# Patient Record
Sex: Female | Born: 1985 | Race: Asian | Hispanic: No | Marital: Married | Smoking: Smoker, current status unknown
Health system: Southern US, Community
[De-identification: ages and names within clinical notes are randomized; demographics above are authoritative.]

---

## 2006-10-31 ENCOUNTER — Ambulatory Visit (HOSPITAL_COMMUNITY): Admission: RE | Admit: 2006-10-31 | Discharge: 2006-10-31 | Payer: Self-pay | Admitting: Obstetrics & Gynecology

## 2007-03-30 ENCOUNTER — Inpatient Hospital Stay (HOSPITAL_COMMUNITY): Admission: AD | Admit: 2007-03-30 | Discharge: 2007-04-02 | Payer: Self-pay | Admitting: Obstetrics & Gynecology

## 2007-03-31 ENCOUNTER — Encounter: Payer: Self-pay | Admitting: Obstetrics

## 2007-06-02 IMAGING — US US OB COMP +14 WK
1 series · 13 of 28 positions shown · non-contrast
Comparison: none

CLINICAL DATA: 21 week 4 day gestational age by LMP, although dates are somewhat uncertain.  Evaluate dating and anatomy.

[Series 1: us ob comp +14 wk · 0.24mm/px · 13 of 100 slices shown]
[im 4/100]
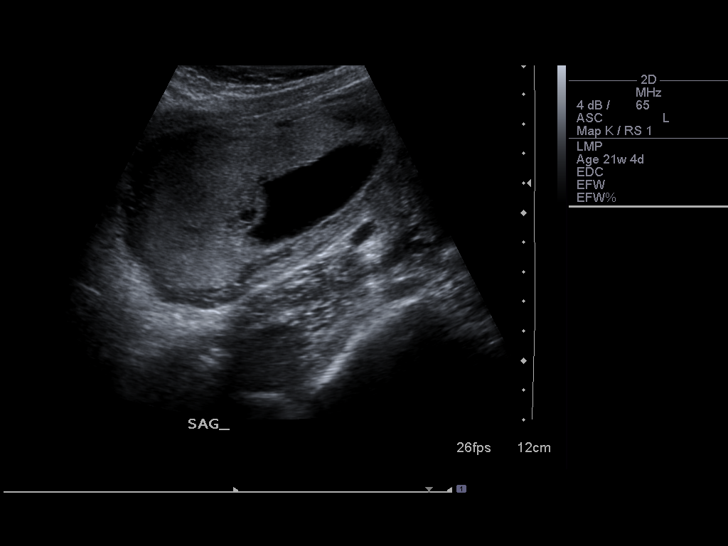
[im 12/100]
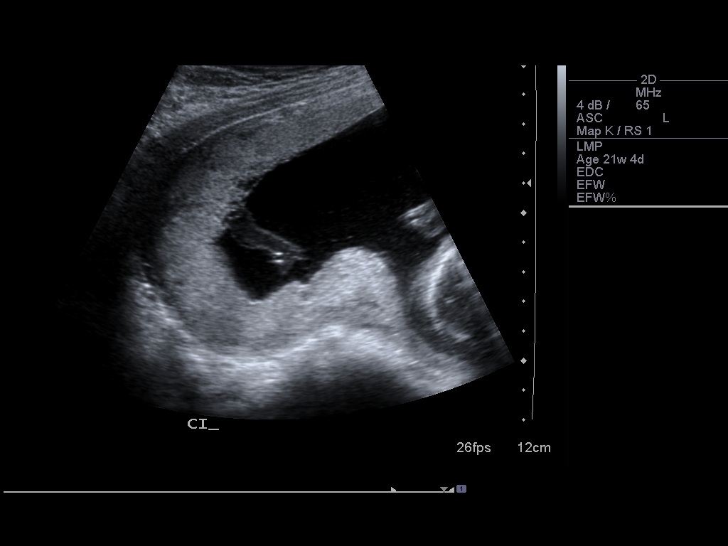
[im 19/100]
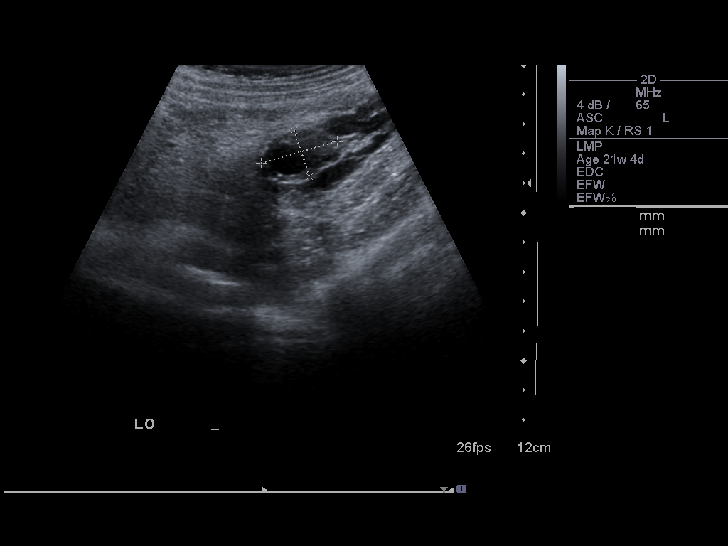
[im 26/100]
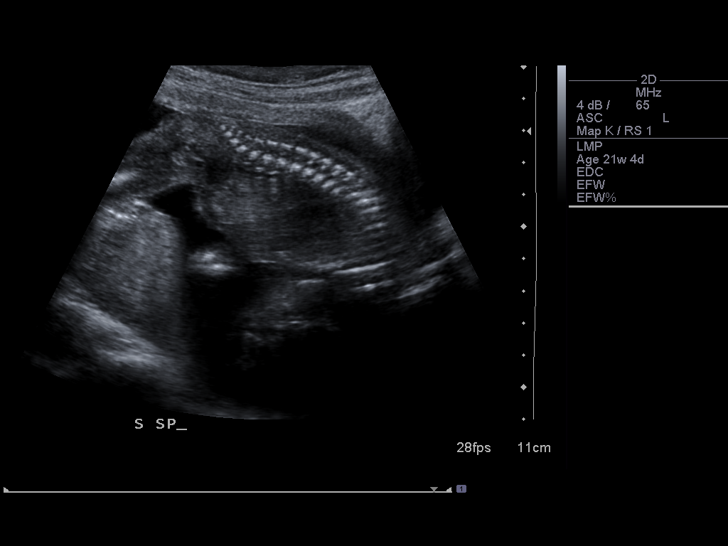
[im 34/100]
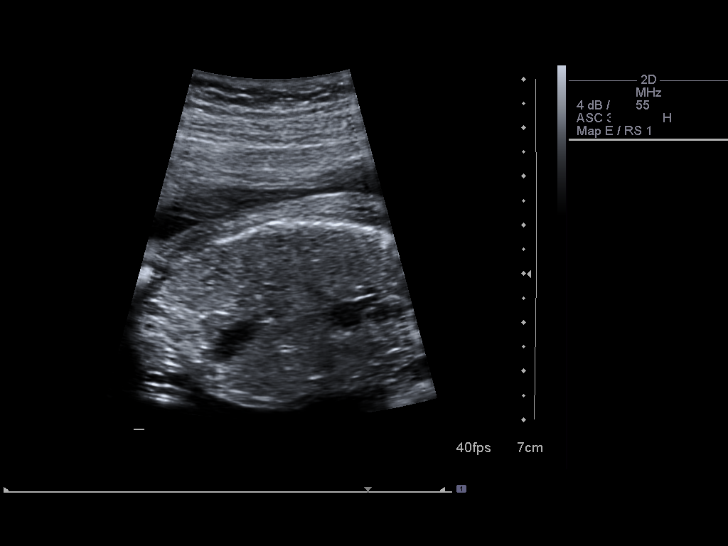
[im 41/100]
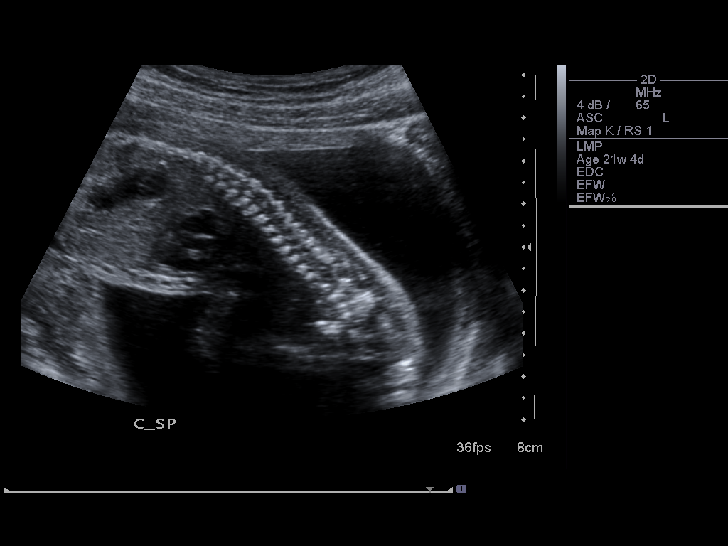
[im 52/100]
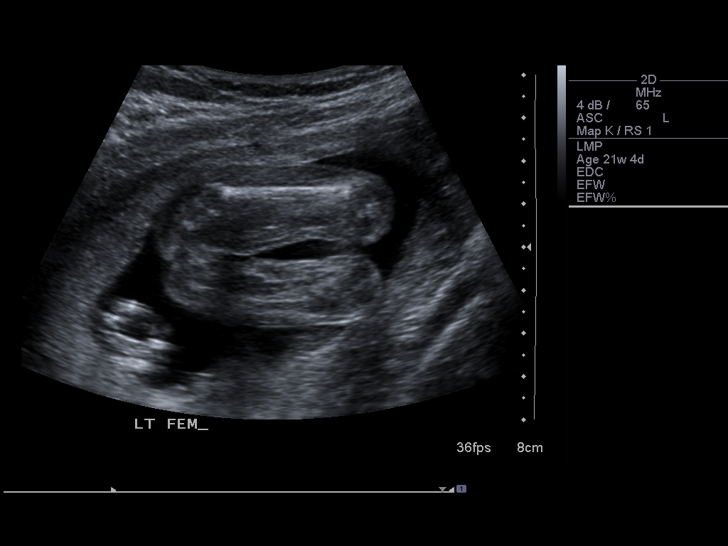
[im 59/100]
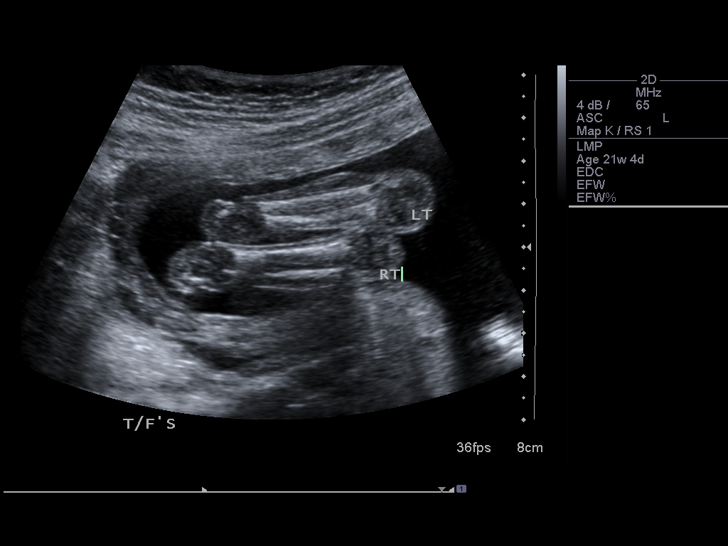
[im 67/100]
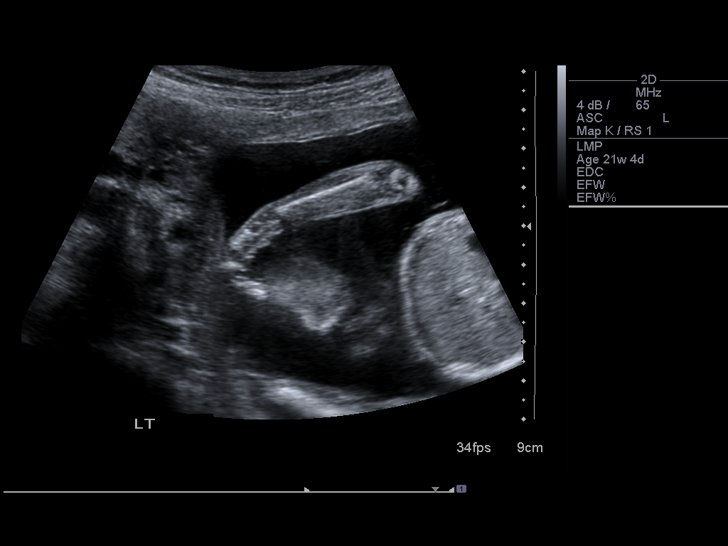
[im 74/100]
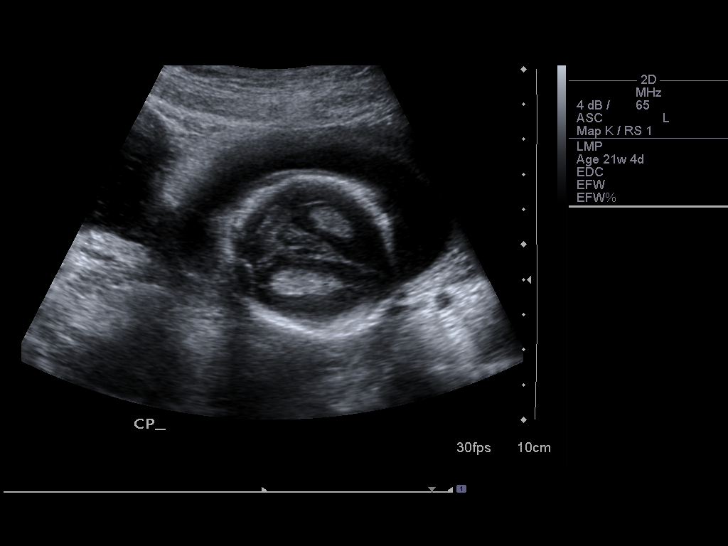
[im 81/100]
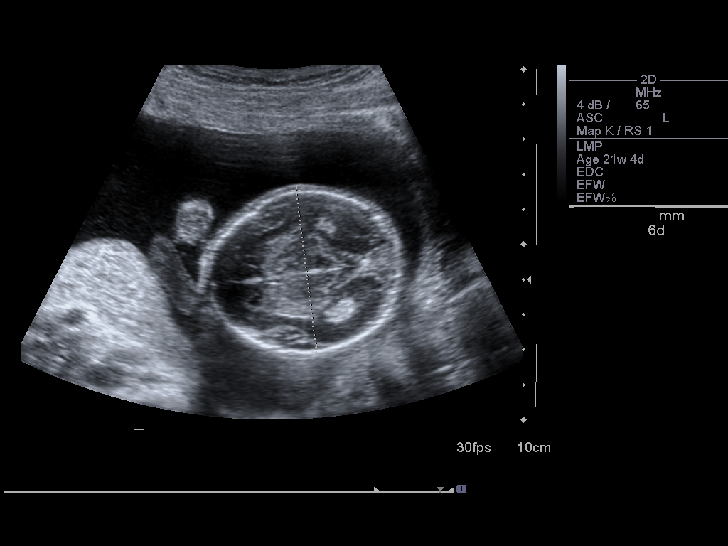
[im 89/100]
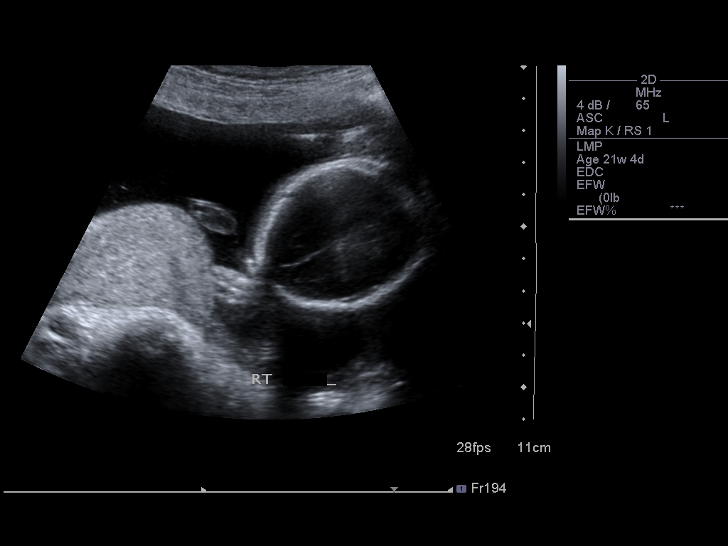
[im 96/100]
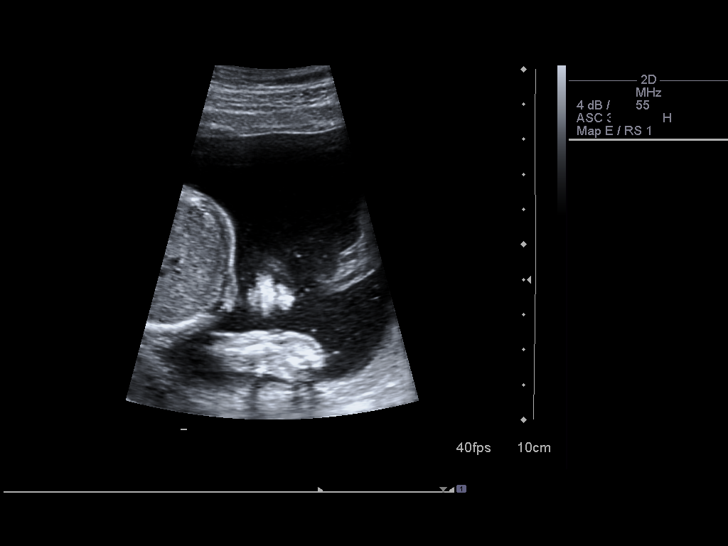

[13 of 28 positions shown; findings below may reference images not displayed]

OBSTETRICAL ULTRASOUND:
Number of Fetuses:  1
Heart Rate:  137 bpm
Movement:  Yes
Breathing:  No
Presentation:  Cephalic
Placental Location:  Fundal, anterior
Grade:  I
Previa:  No
Amniotic Fluid (Subjective):  Normal
Amniotic Fluid (Objective):  5.2 cm vertical pocket 

FETAL BIOMETRY
BPD:  4.5 cm     19 w 5 d 
HC:  16.4 cm   19 w 1 d 
AC:  14.0 cm   19 w 3 d 
FL:  3.1 cm   19 w 5 d 

MEAN GA:  19 w 4 d   US EDC:  03/23/07

FETAL ANATOMY
Lateral Ventricles:  Visualized 
Thalami/CSP:  Visualized 
Posterior Fossa:  Visualized 
Nuchal Region:  NF= 3.0 mm  Visualized 
Spine:  Visualized 
4 Chamber Heart on Left:  Visualized 
Stomach on Left:  Visualized 
3 Vessel Cord:  Visualized 
Cord Insertion site:  Visualized 
Kidneys:  Visualized 
Bladder:  Visualized 
Extremities:  Visualized 

ADDITIONAL ANATOMY VISUALIZED:  LVOT, RVOT, upper lip, orbits, profile, diaphragm, heel, ductal arch, and aortic arch.

MATERNAL UTERINE AND ADNEXAL FINDINGS
Cervix:  3.1 cm transabdominal.  

Both ovaries are unremarkable.
IMPRESSION: 1.  Single living intrauterine fetus with mean gestational age of 19 weeks 4 days and US EDC of 03/23/07.  This is 2 weeks behind EGA by LMP, and likely due to inaccurate LMP.  Correlation with menstrual history is recommended, and consider ultrasound follow-up of fetal growth in 2-3 weeks.  
2.  No evidence of fetal anatomic abnormality.

## 2008-12-30 ENCOUNTER — Inpatient Hospital Stay (HOSPITAL_COMMUNITY): Admission: AD | Admit: 2008-12-30 | Discharge: 2009-01-01 | Payer: Self-pay | Admitting: Obstetrics

## 2011-02-26 LAB — CCBB MATERNAL DONOR DRAW

## 2011-02-26 LAB — CBC
Hemoglobin: 6.2 g/dL — CL (ref 12.0–15.0)
MCHC: 31.8 g/dL (ref 30.0–36.0)
MCHC: 32.2 g/dL (ref 30.0–36.0)
MCV: 74.6 fL — ABNORMAL LOW (ref 78.0–100.0)
Platelets: 270 10*3/uL (ref 150–400)
RDW: 16.4 % — ABNORMAL HIGH (ref 11.5–15.5)
RDW: 16.5 % — ABNORMAL HIGH (ref 11.5–15.5)

## 2011-02-26 LAB — RPR: RPR Ser Ql: NONREACTIVE

## 2019-11-15 ENCOUNTER — Ambulatory Visit: Payer: Medicaid Other | Attending: Internal Medicine

## 2019-11-15 DIAGNOSIS — Z20822 Contact with and (suspected) exposure to covid-19: Secondary | ICD-10-CM

## 2019-11-16 LAB — NOVEL CORONAVIRUS, NAA: SARS-CoV-2, NAA: DETECTED — AB

## 2019-11-24 ENCOUNTER — Other Ambulatory Visit: Payer: Medicaid Other

## 2019-11-25 ENCOUNTER — Ambulatory Visit: Payer: Medicaid Other | Attending: Internal Medicine

## 2019-11-25 DIAGNOSIS — Z20822 Contact with and (suspected) exposure to covid-19: Secondary | ICD-10-CM

## 2019-11-26 ENCOUNTER — Other Ambulatory Visit: Payer: Medicaid Other

## 2019-11-26 LAB — NOVEL CORONAVIRUS, NAA: SARS-CoV-2, NAA: NOT DETECTED

## 2019-12-21 ENCOUNTER — Other Ambulatory Visit: Payer: Self-pay

## 2019-12-21 ENCOUNTER — Other Ambulatory Visit (HOSPITAL_COMMUNITY)
Admission: RE | Admit: 2019-12-21 | Discharge: 2019-12-21 | Disposition: A | Discharge status: Home / Self Care | Payer: Medicaid Other | Source: Ambulatory Visit | Attending: Obstetrics & Gynecology | Admitting: Obstetrics & Gynecology

## 2019-12-21 ENCOUNTER — Encounter: Payer: Self-pay | Admitting: Obstetrics & Gynecology

## 2019-12-21 ENCOUNTER — Ambulatory Visit: Payer: Medicaid Other | Admitting: Obstetrics & Gynecology

## 2019-12-21 VITALS — BP 124/86 | HR 92 | Ht 63.0 in | Wt 179.7 lb

## 2019-12-21 DIAGNOSIS — Z3202 Encounter for pregnancy test, result negative: Secondary | ICD-10-CM

## 2019-12-21 DIAGNOSIS — R87610 Atypical squamous cells of undetermined significance on cytologic smear of cervix (ASC-US): Secondary | ICD-10-CM | POA: Diagnosis present

## 2019-12-21 DIAGNOSIS — R87810 Cervical high risk human papillomavirus (HPV) DNA test positive: Secondary | ICD-10-CM

## 2019-12-21 DIAGNOSIS — N870 Mild cervical dysplasia: Secondary | ICD-10-CM

## 2019-12-21 DIAGNOSIS — N898 Other specified noninflammatory disorders of vagina: Secondary | ICD-10-CM | POA: Insufficient documentation

## 2019-12-21 LAB — POCT URINE PREGNANCY: Preg Test, Ur: NEGATIVE

## 2019-12-21 NOTE — Progress Notes (Signed)
NGYN

## 2019-12-21 NOTE — Progress Notes (Signed)
Patient ID: Rhonda Morgan, female   DOB: [birth year], 34 y.o.   MRN: [id number]  Chief Complaint  Patient presents with  . Colposcopy  . Abnormal Pap Smear    HPI Rhonda Morgan is a 34 y.o. female.  [id number] No LMP recorded. Patient has had an implant.  HPI  Indications: Pap smear on Dec 2020 showed: ASCUS with POSITIVE high risk HPV. Previous colposcopy: no. Prior cervical treatment: no treatment.  History reviewed. No pertinent past medical history.  History reviewed. No pertinent surgical history.  History reviewed. No pertinent family history.  Social History Social History   Tobacco Use  . Smoking status: Smoker, Current Status Unknown  . Smokeless tobacco: Never Used  Substance Use Topics  . Alcohol use: Not Currently  . Drug use: Never    No Known Allergies  No current outpatient medications on file.   No current facility-administered medications for this visit.    Review of Systems Review of Systems  Constitutional: Negative.   HENT: Negative.   Genitourinary: Positive for menstrual problem (irregular menses) and vaginal discharge. Negative for vaginal bleeding.    Blood pressure 124/86, pulse 92, height 5\' 3"  (1.6 m), weight 179 lb 11.2 oz (81.5 kg).  Physical Exam Physical Exam Vitals and nursing note reviewed. Exam conducted with a chaperone present.   Stallings RN present  Data Reviewed Pap result  Assessment    Procedure Details  The risks and benefits of the procedure and Written informed consent obtained.  Speculum placed in vagina and excellent visualization of cervix achieved, cervix swabbed x 3 with acetic acid solution. TZ, SCJ seen, no endocervical lesion, minimal AWE Specimens: ECC and 12 and 6  Complications: none.     Plan    Specimens labelled and sent to Pathology. Discuss Pathology results in 2 weeks.      12/21/2019, 9:55 AM

## 2019-12-21 NOTE — Patient Instructions (Signed)
Colposcopy, Care After This sheet gives you information about how to care for yourself after your procedure. Your doctor may also give you more specific instructions. If you have problems or questions, contact your doctor. What can I expect after the procedure? If you did not have a tissue sample removed (did not have a biopsy), you may only have some spotting for a few days. You can go back to your normal activities. If you had a tissue sample removed, it is common to have:  Soreness and pain. This may last for a few days.  Light-headedness.  Mild bleeding from your vagina or dark-colored, grainy discharge from your vagina. This may last for a few days. You may need to wear a sanitary pad.  Spotting for at least 48 hours after the procedure. Follow these instructions at home:   Take over-the-counter and prescription medicines only as told by your doctor. Ask your doctor what medicines you can start taking again. This is very important if you take blood-thinning medicine.  Do not drive or use heavy machinery while taking prescription pain medicine.  For 3 days, or as long as your doctor tells you, avoid: ? Douching. ? Using tampons. ? Having sex.  If you use birth control (contraception), keep using it.  Limit activity for the first day after the procedure. Ask your doctor what activities are safe for you.  It is up to you to get the results of your procedure. Ask your doctor when your results will be ready.  Keep all follow-up visits as told by your doctor. This is important. Contact a doctor if:  You get a skin rash. Get help right away if:  You are bleeding a lot from your vagina. It is a lot of bleeding if you are using more than one pad an hour for 2 hours in a row.  You have clumps of blood (blood clots) coming from your vagina.  You have a fever.  You have chills  You have pain in your lower belly (pelvic area).  You have signs of infection, such as vaginal  discharge that is: ? Different than usual. ? Yellow. ? Bad-smelling.  You have very pain or cramps in your lower belly that do not get better with medicine.  You feel light-headed.  You feel dizzy.  You pass out (faint). Summary  If you did not have a tissue sample removed (did not have a biopsy), you may only have some spotting for a few days. You can go back to your normal activities.  If you had a tissue sample removed, it is common to have mild pain and spotting for 48 hours.  For 3 days, or as long as your doctor tells you, avoid douching, using tampons and having sex.  Get help right away if you have bleeding, very bad pain, or signs of infection. This information is not intended to replace advice given to you by your health care provider. Make sure you discuss any questions you have with your health care provider. Document Revised: 10/10/2017 Document Reviewed: 07/17/2016 Elsevier Patient Education  2020 [employer]  

## 2019-12-22 DIAGNOSIS — R87610 Atypical squamous cells of undetermined significance on cytologic smear of cervix (ASC-US): Secondary | ICD-10-CM | POA: Insufficient documentation

## 2019-12-22 DIAGNOSIS — N898 Other specified noninflammatory disorders of vagina: Secondary | ICD-10-CM | POA: Insufficient documentation

## 2019-12-22 DIAGNOSIS — R87810 Cervical high risk human papillomavirus (HPV) DNA test positive: Secondary | ICD-10-CM | POA: Insufficient documentation

## 2019-12-23 ENCOUNTER — Telehealth: Payer: Self-pay | Admitting: *Deleted

## 2019-12-23 ENCOUNTER — Other Ambulatory Visit: Payer: Self-pay | Admitting: Obstetrics & Gynecology

## 2019-12-23 DIAGNOSIS — N898 Other specified noninflammatory disorders of vagina: Secondary | ICD-10-CM

## 2019-12-23 LAB — CERVICOVAGINAL ANCILLARY ONLY
Bacterial Vaginitis (gardnerella): POSITIVE — AB
Candida Glabrata: NEGATIVE
Candida Vaginitis: NEGATIVE
Chlamydia: NEGATIVE
Comment: NEGATIVE
Comment: NEGATIVE
Comment: NEGATIVE
Comment: NEGATIVE
Comment: NEGATIVE
Comment: NORMAL
Neisseria Gonorrhea: NEGATIVE
Trichomonas: NEGATIVE

## 2019-12-23 LAB — SURGICAL PATHOLOGY

## 2019-12-23 MED ORDER — METRONIDAZOLE 500 MG PO TABS: 500.0000 mg | ORAL_TABLET | Freq: Two times a day (BID) | ORAL | 0 refills | Status: AC

## 2019-12-23 NOTE — Telephone Encounter (Signed)
Pt called for lab results. Results reviewed and made aware of Rx.

## 2024-05-28 ENCOUNTER — Ambulatory Visit (INDEPENDENT_AMBULATORY_CARE_PROVIDER_SITE_OTHER)

## 2024-05-28 ENCOUNTER — Ambulatory Visit
Admission: RE | Admit: 2024-05-28 | Discharge: 2024-05-28 | Disposition: A | Discharge status: Home / Self Care | Payer: Self-pay | Source: Ambulatory Visit | Attending: Family Medicine | Admitting: Family Medicine

## 2024-05-28 VITALS — BP 123/84 | HR 81 | Temp 98.5°F | Resp 16

## 2024-05-28 DIAGNOSIS — M79671 Pain in right foot: Secondary | ICD-10-CM

## 2024-05-28 DIAGNOSIS — M79674 Pain in right toe(s): Secondary | ICD-10-CM | POA: Diagnosis not present

## 2024-05-28 DIAGNOSIS — G8929 Other chronic pain: Secondary | ICD-10-CM | POA: Diagnosis not present

## 2024-05-28 MED ORDER — IBUPROFEN 600 MG PO TABS: 600.0000 mg | ORAL_TABLET | Freq: Four times a day (QID) | ORAL | 0 refills | Status: AC | PRN

## 2024-05-28 NOTE — ED Triage Notes (Signed)
 Pt states right second toe pain for the past 2 months.  Denies injury.

## 2024-05-28 NOTE — ED Provider Notes (Signed)
 [facility]  Note:  This document was prepared using Conservation officer, historic buildings and may include unintentional dictation errors.  MRN: [id number] DOB: [birth year]  Subjective:   Rhonda Morgan is a 38 y.o. female presenting for 6-month history of chronic right foot pain, right second toe pain.  No fall, trauma, swelling, redness, wounds, drainage.  No rashes.  Patient works at [employer] and stands for long periods of time or walks for the entirety of her shift.  No current facility-administered medications for this encounter. No current outpatient medications on file.   No Known Allergies  History reviewed. No pertinent past medical history.   History reviewed. No pertinent surgical history.  History reviewed. No pertinent family history.  Social History   Tobacco Use   Smoking status: Smoker, Current Status Unknown   Smokeless tobacco: Never  Substance Use Topics   Alcohol use: Not Currently   Drug use: Never    ROS   Objective:   Vitals: BP 123/84 (BP Location: Right Arm)   Pulse 81   Temp 98.5 F (36.9 C) (Oral)   Resp 16   LMP  (LMP Unknown)   SpO2 98%   Physical Exam Constitutional:      General: She is not in acute distress.    Appearance: Normal appearance. She is well-developed. She is not ill-appearing, toxic-appearing or diaphoretic.  HENT:     Head: Normocephalic and atraumatic.     Nose: Nose normal.     Mouth/Throat:     Mouth: Mucous membranes are moist.  Eyes:     General: No scleral icterus.       Right eye: No discharge.        Left eye: No discharge.     Extraocular Movements: Extraocular movements intact.  Cardiovascular:     Rate and Rhythm: Normal rate.  Pulmonary:     Effort: Pulmonary effort is normal.  Musculoskeletal:       Feet:  Skin:    General: Skin is warm and dry.  Neurological:     General: No focal deficit present.     Mental Status: She is alert and oriented to person, place, and  time.  Psychiatric:        Mood and Affect: Mood normal.        Behavior: Behavior normal.     DG Foot Complete Right Result Date: 05/28/2024 CLINICAL DATA:  38 year old female with right foot pain, right 2nd toe pain for the past 2 months. No known injury. EXAM: RIGHT FOOT COMPLETE - 3+ VIEW COMPARISON:  Right toe series 01/26/2020. FINDINGS: Three views. Bone mineralization is within normal limits. There is no evidence of fracture or dislocation. Joint spaces and alignment are within normal limits for age. No acute osseous abnormality identified. Soft tissue contours are within normal limits. IMPRESSION: Negative. Electronically Signed   By: VEAR Hurst M.D.   On: 05/28/2024 08:57    Assessment and Plan :   PDMP not reviewed this encounter.  1. Chronic pain of toe of right foot   2. Right foot pain     Suspect inflammatory process due to the nature of her work.  Unfortunately we do not currently have her postop shoe size in stock.  Can purchase one at the pharmacy.  Recommend patient use ibuprofen, follow-up with podiatry for her chronic foot pain.  Counseled patient on potential for adverse effects with medications prescribed/recommended today, ER and return-to-clinic precautions discussed, patient verbalized understanding.  [name], [place] 05/28/24 [phone]

## 2024-06-10 ENCOUNTER — Ambulatory Visit (INDEPENDENT_AMBULATORY_CARE_PROVIDER_SITE_OTHER)

## 2024-06-10 ENCOUNTER — Encounter: Payer: Self-pay | Admitting: Podiatry

## 2024-06-10 ENCOUNTER — Ambulatory Visit (INDEPENDENT_AMBULATORY_CARE_PROVIDER_SITE_OTHER): Admitting: Podiatry

## 2024-06-10 DIAGNOSIS — M722 Plantar fascial fibromatosis: Secondary | ICD-10-CM

## 2024-06-10 DIAGNOSIS — M7751 Other enthesopathy of right foot: Secondary | ICD-10-CM

## 2024-06-10 MED ORDER — TRIAMCINOLONE ACETONIDE 10 MG/ML IJ SUSP
10.0000 mg | Freq: Once | INTRAMUSCULAR | Status: AC
  Administered 2024-06-10: 10 mg via INTRA_ARTICULAR

## 2024-06-10 NOTE — Progress Notes (Signed)
 Subjective:   Patient ID: Rhonda Morgan, female   DOB: 38 y.o.   MRN: [id number]   HPI Patient presents with a lot of forefoot pain right with inflammation of the second metatarsal joint that she states has been present for several months and into the arch.  Patient does work and walk all day at work and does not smoke   Review of Systems  All other systems reviewed and are negative.       Objective:  Physical Exam Vitals and nursing note reviewed.  Constitutional:      Appearance: She is well-developed.  Pulmonary:     Effort: Pulmonary effort is normal.  Musculoskeletal:        General: Normal range of motion.  Skin:    General: Skin is warm.  Neurological:     Mental Status: She is alert.     Neurovascular status intact muscle strength found to be adequate range of motion within normal limits with patient noted to have inflammation of the second metatarsal phalangeal joint right fluid buildup around that toe joint and also has moderate discomfort into the arch right which may be compensatory     Assessment:  Inflammatory capsulitis right second MPJ into the digit along with low-grade plantar fascial inflammation     Plan:  H&P reviewed condition and x-rays are not recommended we treat the inflamed joint at this time.  Patient wants this done I did block the forefoot right 60 mg like Marcaine mixture sterile prep done to the joint 20-gauge needle aspirated the joint getting out a small amount of clear fluid injected with quarter cc dexamethasone Kenalog  apply thick padding and advised on rigid bottom shoes  X-rays indicate no signs of fracture or arthritis around the joint surface
